# Patient Record
Sex: Female | Born: 1980 | Hispanic: No
Health system: Southern US, Community
[De-identification: ages and names within clinical notes are randomized; demographics above are authoritative.]

## PROBLEM LIST (undated history)

## (undated) DIAGNOSIS — K859 Acute pancreatitis without necrosis or infection, unspecified: Secondary | ICD-10-CM

## (undated) DIAGNOSIS — A419 Sepsis, unspecified organism: Secondary | ICD-10-CM

## (undated) DIAGNOSIS — J80 Acute respiratory distress syndrome: Secondary | ICD-10-CM

## (undated) DIAGNOSIS — J9621 Acute and chronic respiratory failure with hypoxia: Secondary | ICD-10-CM

## (undated) DIAGNOSIS — N170 Acute kidney failure with tubular necrosis: Secondary | ICD-10-CM

## (undated) DIAGNOSIS — R6520 Severe sepsis without septic shock: Secondary | ICD-10-CM

---

## 2019-07-24 ENCOUNTER — Inpatient Hospital Stay
Admission: RE | Admit: 2019-07-24 | Discharge: 2019-08-10 | Disposition: A | Discharge status: Skilled Nursing Facility | Payer: Medicare HMO | Source: Other Acute Inpatient Hospital | Attending: Internal Medicine | Admitting: Internal Medicine

## 2019-07-24 ENCOUNTER — Other Ambulatory Visit (HOSPITAL_COMMUNITY): Payer: Medicare HMO

## 2019-07-24 DIAGNOSIS — Z931 Gastrostomy status: Secondary | ICD-10-CM

## 2019-07-24 DIAGNOSIS — K859 Acute pancreatitis without necrosis or infection, unspecified: Secondary | ICD-10-CM | POA: Diagnosis present

## 2019-07-24 DIAGNOSIS — J80 Acute respiratory distress syndrome: Secondary | ICD-10-CM | POA: Diagnosis present

## 2019-07-24 DIAGNOSIS — Z95828 Presence of other vascular implants and grafts: Secondary | ICD-10-CM

## 2019-07-24 DIAGNOSIS — N170 Acute kidney failure with tubular necrosis: Secondary | ICD-10-CM | POA: Diagnosis present

## 2019-07-24 DIAGNOSIS — J9621 Acute and chronic respiratory failure with hypoxia: Secondary | ICD-10-CM | POA: Diagnosis present

## 2019-07-24 DIAGNOSIS — A419 Sepsis, unspecified organism: Secondary | ICD-10-CM | POA: Diagnosis present

## 2019-07-24 HISTORY — DX: Sepsis, unspecified organism: R65.20

## 2019-07-24 HISTORY — DX: Acute and chronic respiratory failure with hypoxia: J96.21

## 2019-07-24 HISTORY — DX: Acute pancreatitis without necrosis or infection, unspecified: K85.90

## 2019-07-24 HISTORY — DX: Acute respiratory distress syndrome: J80

## 2019-07-24 HISTORY — DX: Sepsis, unspecified organism: A41.9

## 2019-07-24 HISTORY — DX: Acute kidney failure with tubular necrosis: N17.0

## 2019-07-25 DIAGNOSIS — A419 Sepsis, unspecified organism: Secondary | ICD-10-CM

## 2019-07-25 DIAGNOSIS — K859 Acute pancreatitis without necrosis or infection, unspecified: Secondary | ICD-10-CM

## 2019-07-25 DIAGNOSIS — J9621 Acute and chronic respiratory failure with hypoxia: Secondary | ICD-10-CM

## 2019-07-25 DIAGNOSIS — J80 Acute respiratory distress syndrome: Secondary | ICD-10-CM

## 2019-07-25 DIAGNOSIS — N170 Acute kidney failure with tubular necrosis: Secondary | ICD-10-CM

## 2019-07-25 DIAGNOSIS — R6520 Severe sepsis without septic shock: Secondary | ICD-10-CM

## 2019-07-25 LAB — CBC
HCT: 24.4 % — ABNORMAL LOW (ref 36.0–46.0)
Hemoglobin: 7.7 g/dL — ABNORMAL LOW (ref 12.0–15.0)
MCH: 30 pg (ref 26.0–34.0)
MCHC: 31.6 g/dL (ref 30.0–36.0)
MCV: 94.9 fL (ref 80.0–100.0)
Platelets: 165 10*3/uL (ref 150–400)
RBC: 2.57 MIL/uL — ABNORMAL LOW (ref 3.87–5.11)
RDW: 18.1 % — ABNORMAL HIGH (ref 11.5–15.5)
WBC: 5.5 10*3/uL (ref 4.0–10.5)
nRBC: 0 % (ref 0.0–0.2)

## 2019-07-25 LAB — COMPREHENSIVE METABOLIC PANEL
ALT: 19 U/L (ref 0–44)
AST: 16 U/L (ref 15–41)
Albumin: 2.8 g/dL — ABNORMAL LOW (ref 3.5–5.0)
Alkaline Phosphatase: 109 U/L (ref 38–126)
Anion gap: 11 (ref 5–15)
BUN: 22 mg/dL — ABNORMAL HIGH (ref 6–20)
CO2: 31 mmol/L (ref 22–32)
Calcium: 9 mg/dL (ref 8.9–10.3)
Chloride: 98 mmol/L (ref 98–111)
Creatinine, Ser: 0.67 mg/dL (ref 0.44–1.00)
GFR calc Af Amer: >60 mL/min (ref >60)
GFR calc non Af Amer: >60 mL/min (ref >60)
Glucose, Bld: 202 mg/dL — ABNORMAL HIGH (ref 70–99)
Potassium: 3.3 mmol/L — ABNORMAL LOW (ref 3.5–5.1)
Sodium: 140 mmol/L (ref 135–145)
Total Bilirubin: 0.9 mg/dL (ref 0.3–1.2)
Total Protein: 6.2 g/dL — ABNORMAL LOW (ref 6.5–8.1)

## 2019-07-25 MED ORDER — GENERIC EXTERNAL MEDICATION
Status: DC
Start: ? — End: 2019-07-25

## 2019-07-25 MED ORDER — HEPARIN SODIUM (PORCINE) 5000 UNIT/ML IJ SOLN
7500.00 | INTRAMUSCULAR | Status: DC
Start: 2019-07-24 — End: 2019-07-25

## 2019-07-25 MED ORDER — HYDRALAZINE HCL 50 MG PO TABS
50.00 | ORAL_TABLET | ORAL | Status: DC
Start: 2019-07-24 — End: 2019-07-25

## 2019-07-25 MED ORDER — INSULIN ASPART 100 UNIT/ML FLEXPEN
0.00 | PEN_INJECTOR | SUBCUTANEOUS | Status: DC
Start: 2019-07-24 — End: 2019-07-25

## 2019-07-25 MED ORDER — OXCARBAZEPINE 300 MG PO TABS
600.00 | ORAL_TABLET | ORAL | Status: DC
Start: 2019-07-24 — End: 2019-07-25

## 2019-07-25 MED ORDER — ROPINIROLE HCL 0.5 MG PO TABS
1.00 | ORAL_TABLET | ORAL | Status: DC
Start: 2019-07-24 — End: 2019-07-25

## 2019-07-25 MED ORDER — INSULIN NPH (HUMAN) (ISOPHANE) 100 UNIT/ML ~~LOC~~ SUSP
25.00 | SUBCUTANEOUS | Status: DC
Start: 2019-07-24 — End: 2019-07-25

## 2019-07-25 MED ORDER — PREGABALIN 100 MG PO CAPS
100.00 | ORAL_CAPSULE | ORAL | Status: DC
Start: 2019-07-24 — End: 2019-07-25

## 2019-07-25 MED ORDER — DOXEPIN HCL 25 MG PO CAPS
25.00 | ORAL_CAPSULE | ORAL | Status: DC
Start: 2019-07-24 — End: 2019-07-25

## 2019-07-25 MED ORDER — OXCARBAZEPINE 300 MG PO TABS
300.00 | ORAL_TABLET | ORAL | Status: DC
Start: 2019-07-25 — End: 2019-07-25

## 2019-07-25 MED ORDER — SCOPOLAMINE 1 MG/3DAYS TD PT72
1.50 | MEDICATED_PATCH | TRANSDERMAL | Status: DC
Start: 2019-07-26 — End: 2019-07-25

## 2019-07-25 MED ORDER — FUROSEMIDE 10 MG/ML IJ SOLN
40.00 | INTRAMUSCULAR | Status: DC
Start: 2019-07-25 — End: 2019-07-25

## 2019-07-25 MED ORDER — ESCITALOPRAM OXALATE 10 MG PO TABS
10.00 | ORAL_TABLET | ORAL | Status: DC
Start: 2019-07-25 — End: 2019-07-25

## 2019-07-25 MED ORDER — MONTELUKAST SODIUM 10 MG PO TABS
10.00 | ORAL_TABLET | ORAL | Status: DC
Start: 2019-07-24 — End: 2019-07-25

## 2019-07-25 MED ORDER — LACTATED RINGERS IV SOLN
500.00 | INTRAVENOUS | Status: DC
Start: ? — End: 2019-07-25

## 2019-07-25 MED ORDER — PANTOPRAZOLE SODIUM 40 MG IV SOLR
40.00 | INTRAVENOUS | Status: DC
Start: 2019-07-24 — End: 2019-07-25

## 2019-07-25 MED ORDER — FAMOTIDINE 20 MG/2ML IV SOLN
20.00 | INTRAVENOUS | Status: DC
Start: 2019-07-24 — End: 2019-07-25

## 2019-07-25 MED ORDER — MELATONIN 3 MG PO TABS
3.00 | ORAL_TABLET | ORAL | Status: DC
Start: 2019-07-24 — End: 2019-07-25

## 2019-07-25 MED ORDER — AMLODIPINE BESYLATE 10 MG PO TABS
10.00 | ORAL_TABLET | ORAL | Status: DC
Start: 2019-07-25 — End: 2019-07-25

## 2019-07-25 MED ORDER — RA PROBIOTIC DIGESTIVE CARE PO CAPS
1.00 | ORAL_CAPSULE | ORAL | Status: DC
Start: 2019-07-24 — End: 2019-07-25

## 2019-07-25 MED ORDER — COLLAGENASE 250 UNIT/GM EX OINT
TOPICAL_OINTMENT | CUTANEOUS | Status: DC
Start: 2019-07-25 — End: 2019-07-25

## 2019-07-25 MED ORDER — CLONAZEPAM 1 MG PO TABS
1.00 | ORAL_TABLET | ORAL | Status: DC
Start: 2019-07-24 — End: 2019-07-25

## 2019-07-25 MED ORDER — ATORVASTATIN CALCIUM 10 MG PO TABS
5.00 | ORAL_TABLET | ORAL | Status: DC
Start: 2019-07-24 — End: 2019-07-25

## 2019-07-25 MED ORDER — UMECLIDINIUM BROMIDE 62.5 MCG/INH IN AEPB
1.00 | INHALATION_SPRAY | RESPIRATORY_TRACT | Status: DC
Start: 2019-07-24 — End: 2019-07-25

## 2019-07-25 MED ORDER — CLONIDINE HCL 0.2 MG PO TABS
0.20 | ORAL_TABLET | ORAL | Status: DC
Start: 2019-07-24 — End: 2019-07-25

## 2019-07-25 MED ORDER — TRAZODONE HCL 100 MG PO TABS
300.00 | ORAL_TABLET | ORAL | Status: DC
Start: 2019-07-24 — End: 2019-07-25

## 2019-07-25 MED ORDER — ACETAMINOPHEN 325 MG PO TABS
650.00 | ORAL_TABLET | ORAL | Status: DC
Start: ? — End: 2019-07-25

## 2019-07-25 MED ORDER — METOPROLOL TARTRATE 25 MG PO TABS
75.00 | ORAL_TABLET | ORAL | Status: DC
Start: 2019-07-24 — End: 2019-07-25

## 2019-07-25 MED ORDER — GEMFIBROZIL 600 MG PO TABS
600.00 | ORAL_TABLET | ORAL | Status: DC
Start: 2019-07-25 — End: 2019-07-25

## 2019-07-25 NOTE — Consult Note (Signed)
[facility]  Date of Service: 07/25/2019  PULMONARY CRITICAL CARE Erica Santos  VXB:[id number]  DOB: 14-Jun-1980   DOA: 07/24/2019  Referring Physician: Merton Border, MD  HPI: Erica Santos is a 39 y.o. female seen for follow up of Acute on Chronic Respiratory Failure.  Patient has multiple medical problems including CHF COPD chronic kidney disease depression diabetes hyperlipidemia hypertension anxiety disorder who presented to the hospital because of abdominal pain.  Apparently the pain had been going on for about 2 days prior to admission and was located in the epigastric and supraumbilical area.  Patient was admitted to the hospital for evaluation at that time for possibility of pancreatitis.  Hospital course was complicated patient underwent CT scan consistent with pancreatitis without abscess.  Patient was treated with IV fluids as well as antibiotics.  She had some other complications including tachycardia respiratory failure ended up on the ventilator and then was having a great deal of difficulty weaning off the ventilator.  She comes to [place] now with the T collar.  Patient also did develop progressive renal failure requiring CRRT was then transitioned over to dialysis for short-term subsequent resolution.  She has been doing T collar trials and is advancing rather quickly on her weaning now  Review of Systems:  ROS performed and is unremarkable other than noted above.  PAST MEDICAL HISTORY Past Medical History:  Diagnosis Date  . Anxiety  . Barrett esophagus  . CHF (congestive heart failure) ([place])  . Chronic GERD  . Chronic kidney disease  . COPD, mild ([place])  . Depression  . Diabetes mellitus ([place])  . Fatty liver  fatty liver  . Hx of echocardiogram 12/20/2018  2D TTE (12/02/2018) EF 60-65%, with normal diastolic filling pattern, RV not clearly visualized unfortunately.  . Hyperlipidemia  .  Hypertension  . IBS (irritable bowel syndrome)  . Kyphosis  . Overactive bladder  . Panic attacks  . Post-operative nausea and vomiting  nauseas from nerves  . PTSD (post-traumatic stress disorder)  . Scoliosis  . Splenomegaly    PAST SURGICAL HISTORY Past Surgical History:  Procedure Laterality Date  . HX ADENOIDECTOMY  . HX KNEE ARTHROSCOPY Left  . HX NASAL/SINSUS SURGERY  . HX SINUS SURGERY  . HX TONSILLECTOMY  . HX WISDOM TEETH EXTRACTION  . PR ESOPHAGOGASTRODUODENOSCOPY TRANSORAL DIAGNOSTIC N/A 06/01/2016  ESOPHAGOGASTRODUODENOSCOPY TRANSORAL DIAGNOSTIC performed by Jana Half, MD at [facility]  . PR ESOPHAGOGASTRODUODENOSCOPY TRANSORAL DIAGNOSTIC N/A 01/07/2017  ESOPHAGOGASTRODUODENOSCOPY TRANSORAL DIAGNOSTIC performed by Jana Half, MD at [facility] Allergies  Allergen Reactions  . Influenza Vaccine Tr-S 09 (Pf) Shortness of Breath/Wheezing  . Morphine Hcl Hives  . Doxycycline Nausea and Vomiting  . Aspirin Nausea and Vomiting  bleeding  . Bydureon [Exenatide Microspheres] Muscle Pain  N/V, SWEATS  . Ditropan [Oxybutynin Chloride] Nausea and Vomiting  Dizziness  . Fluconazole Rash and Nausea and Vomiting  . Tramadol Hcl Nausea and Vomiting     Medications: Reviewed on Rounds  Physical Exam:  Vitals: Temperature is 98.0 pulse 95 respiratory rate was 30 blood pressure 143/83 saturations were 98%  Ventilator Settings off the ventilator on T collar with an FiO2 35%  . General: Comfortable at this time . Eyes: Grossly normal lids, irises & conjunctiva . ENT: grossly tongue is normal . Neck: no obvious mass . Cardiovascular: S1-S2 normal no gallop or rub . Respiratory: No rhonchi no rales are noted at this  time . Abdomen: Soft and nontender . Skin: no rash seen on limited exam . Musculoskeletal: not rigid . Psychiatric:unable to assess . Neurologic: no seizure no involuntary movements         Labs on Admission:  Basic Metabolic  Panel: No results for input(s): NA, K, CL, CO2, GLUCOSE, BUN, CREATININE, CALCIUM, MG, PHOS in the last 168 hours.  No results for input(s): PHART, PCO2ART, PO2ART, HCO3, O2SAT in the last 168 hours.  Liver Function Tests: No results for input(s): AST, ALT, ALKPHOS, BILITOT, PROT, ALBUMIN in the last 168 hours. No results for input(s): LIPASE, AMYLASE in the last 168 hours. No results for input(s): AMMONIA in the last 168 hours.  CBC: No results for input(s): WBC, NEUTROABS, HGB, HCT, MCV, PLT in the last 168 hours.  Cardiac Enzymes: No results for input(s): CKTOTAL, CKMB, CKMBINDEX, TROPONINI in the last 168 hours.  BNP (last 3 results) No results for input(s): BNP in the last 8760 hours.  ProBNP (last 3 results) No results for input(s): PROBNP in the last 8760 hours.   Radiological Exams on Admission: DG ABDOMEN PEG TUBE LOCATION  Result Date: 07/24/2019 CLINICAL DATA:  G-tube placement EXAM: ABDOMEN - 1 VIEW COMPARISON:  None. FINDINGS: Injection of contrast through the percutaneous gastrostomy tube opacifies the stomach. There is no obvious free air. The bowel gas pattern is nonobstructive. IMPRESSION: Percutaneous gastrostomy tube in the stomach. No evidence of bowel obstruction. Electronically Signed   By: Katherine Mantle M.D.   On: 07/24/2019 20:34   DG Chest Port 1 View  Result Date: 07/24/2019 CLINICAL DATA:  PICC line placement EXAM: PORTABLE CHEST 1 VIEW COMPARISON:  None. FINDINGS: The tracheostomy tube terminates above the carina. There is a left-sided PICC line that projects over the SVC. The heart size is enlarged. The lung volumes are low. There is mild vascular congestion without overt pulmonary edema. There are streaky bilateral airspace opacities. There is no acute osseous abnormality. There is no pneumothorax. IMPRESSION: 1. Well-positioned left-sided PICC line. 2. Tracheostomy tube terminates above the carina. 3. Cardiomegaly. 4. Mild vascular congestion. There  is streaky bilateral airspace opacities of unknown clinical significance. These may be infectious or inflammatory in etiology. Electronically Signed   By: Katherine Mantle M.D.   On: 07/24/2019 20:34    Assessment/Plan Active Problems:   Acute on chronic respiratory failure with hypoxia (HCC)   Acute respiratory distress syndrome (ARDS) (HCC)   Severe sepsis (HCC)   Acute pancreatitis   Acute renal failure due to tubular necrosis (HCC)   1. Acute on chronic respiratory failure with hypoxia patient currently is on T collar has been on 35% FiO2 doing actually quite well.  The tracheostomy needs to be changed out we will change over to a cuffless type of trach and then begin with capping trials. 2. ARDS patient had improvement and subsequently with the last chest x-ray showing some mild pulmonary vascular congestion with airspace disease still noted which is probably resolving. 3. Severe sepsis and shock this has resolved she is hemodynamically stable. 4. Acute pancreatitis resolved we will continue to monitor her labs closely. 5. Acute renal failure she is now back to baseline renal function she had required hemodialysis at the other facility.  I have personally seen and evaluated the patient, evaluated laboratory and imaging results, formulated the assessment and plan and placed orders. The Patient requires high complexity decision making with multiple systems involvement.  Case was discussed on Rounds with the Respiratory Therapy Director and the  Respiratory staff Time Spent 78minutes  Allyne Gee, MD [facility] Pulmonary Critical Care Medicine Sleep Medicine

## 2019-07-26 ENCOUNTER — Encounter: Payer: Self-pay | Admitting: Internal Medicine

## 2019-07-26 DIAGNOSIS — J80 Acute respiratory distress syndrome: Secondary | ICD-10-CM | POA: Diagnosis not present

## 2019-07-26 DIAGNOSIS — N170 Acute kidney failure with tubular necrosis: Secondary | ICD-10-CM | POA: Diagnosis not present

## 2019-07-26 DIAGNOSIS — J9621 Acute and chronic respiratory failure with hypoxia: Secondary | ICD-10-CM | POA: Diagnosis present

## 2019-07-26 DIAGNOSIS — A419 Sepsis, unspecified organism: Secondary | ICD-10-CM | POA: Diagnosis present

## 2019-07-26 DIAGNOSIS — K859 Acute pancreatitis without necrosis or infection, unspecified: Secondary | ICD-10-CM | POA: Diagnosis present

## 2019-07-26 MED ORDER — GENERIC EXTERNAL MEDICATION
Status: DC
Start: ? — End: 2019-07-26

## 2019-07-26 NOTE — Consult Note (Signed)
Infectious Disease Consultation   [name]  IOX:[id number]  DOB: 04/05/1981  DOA: 07/24/2019  Requesting physician: Dr.Hijazi  Reason for consultation: Antibiotic recommendations   History of Present Illness: Erica Santos is an 39 y.o. female with obesity, diabetes mellitus, chronic diastolic congestive heart failure, COPD, chronic kidney disease, depression, hyperlipidemia, anxiety disorder who was admitted to the acute hospital on 06/13/2019 with abdominal pain.  She was diagnosed with acute pancreatitis.  Hospital course was complicated with worsening abdominal pain.  She underwent CT scan which showed pancreatitis without abscess.  She was treated with IV fluids and antibiotic treatment with IV ceftazidime, Flagyl, vancomycin.  On 06/25/2019 after being treated with fluids and beta-blocker for sinus tachycardia she developed acute dyspnea requiring intubation.  She also required pressors.  She was placed on the ventilator.  She had a difficult time weaning off the ventilator and underwent tracheostomy placement.   Apparently she developed abdominal compartment syndrome and required decompressive laparotomy with wound VAC on 06/16/2019 and abdomen was closed on 06/22/2019.  Subsequently she had a PEG tube placed for nutrition.  Hospital course also complicated by progressive renal failure requiring CRRT and then transitioned to dialysis for short-term.  She had fevers on 07/22/2019 but blood cultures were negative but urine culture showed ESBL E. Coli.  She was started on treatment with meropenem.  She is complaining of nausea and some abdominal discomfort.  She says she had 1 episode of loose stool.  Did not have any more diarrhea.  Denies having any chest pain.  Having some shortness of breath.  On oxygen by nasal cannula.   Review of Systems:  As per HPI otherwise 10 point review of systems negative.    Past Medical History: Past Medical History:  Diagnosis Date  . Acute on chronic  respiratory failure with hypoxia (HCC)   . Acute pancreatitis   . Acute renal failure due to tubular necrosis (HCC)   . Acute respiratory distress syndrome (ARDS) (HCC)   . Severe sepsis (HCC)   Anxiety  . Barrett esophagus  . CHF (congestive heart failure) (HCC)  . Chronic GERD  . Chronic kidney disease  . COPD, mild (HCC)  . Depression  . Diabetes mellitus (HCC)  . Fatty liver  fatty liver  . Hx of echocardiogram 12/20/2018  2D TTE (12/02/2018) EF 60-65%, with normal diastolic filling pattern, RV not clearly visualized unfortunately.  . Hyperlipidemia  . Hypertension  . IBS (irritable bowel syndrome)  . Kyphosis  . Overactive bladder  . Panic attacks  . Post-operative nausea and vomiting  nauseas from nerves  . PTSD (post-traumatic stress disorder)  . Scoliosis  . Splenomegaly   Past Surgical History: HX ADENOIDECTOMY  . HX KNEE ARTHROSCOPY Left  . HX NASAL/SINSUS SURGERY  . HX SINUS SURGERY  . HX TONSILLECTOMY  . HX WISDOM TEETH EXTRACTION  . PR ESOPHAGOGASTRODUODENOSCOPY TRANSORAL DIAGNOSTIC N/A 06/01/2016  ESOPHAGOGASTRODUODENOSCOPY TRANSORAL DIAGNOSTIC performed by Rolly Pancake, MD at [facility] ENDO  . PR ESOPHAGOGASTRODUODENOSCOPY TRANSORAL DIAGNOSTIC N/A 01/07/2017  ESOPHAGOGASTRODUODENOSCOPY TRANSORAL DIAGNOSTIC performed by Rolly Pancake, MD at [facility] ENDO    Allergies: Allergen Reactions  . Influenza Vaccine Tr-S 09 (Pf) Shortness of Breath/Wheezing  . Morphine Hcl Hives  . Doxycycline Nausea and Vomiting  . Aspirin Nausea and Vomiting  bleeding  . Bydureon [Exenatide Microspheres] Muscle Pain  N/V, SWEATS  . Ditropan [Oxybutynin Chloride] Nausea and Vomiting  Dizziness . Fluconazole Rash and Nausea and Vomiting  . Tramadol Hcl Nausea and Vomiting   Social  History: . Smoking status: Current Every Day Smoker  Packs/day: 0.75  Years: 20.00  Pack temperature 98.4, pulse 107, respiratory rate 22, blood pressure 138/78, oxygen saturation 99% 15.00  Types:  Cigarettes  . Smokeless tobacco: Never Used  . Tobacco comment: started smoking at age 67  Substance Use Topics  . Alcohol use: No  . Drug use: No    Family History: Stroke Father  . Heart Disease Mother  . Hypertension Mother  . Cancer Mother  . Thyroid Disease Mother  . Diabetes Mother  . Unknown Maternal Grandmother  . Unknown Maternal Grandfather  . Diabetes Paternal Grandmother  . Unknown Paternal Grandfather     Physical Exam: Vitals: Temperature 98.4, pulse 107, respiratory rate 32, blood pressure 138/78, pulse ox 99% Constitutional: Obese, ill-appearing female, oriented x2 Eyes: PERLA, EOMI, irises appear normal, anicteric sclera,  ENMT: external ears and nose appear normal, normal hearing, Lips appears normal Neck: Has trach in place, trach can CVS: S1-S2, no murmur Respiratory: Coarse breath sounds, rhonchi, no wheezing Abdomen: Obese, mild nonspecific tenderness without any guarding or rebound, healing surgical incision, PEG tube in place, positive bowel sounds  Musculoskeletal: Mild lower extremity edema Neuro: Cranial nerves II-XII grossly nonfocal  Psych: judgement and insight appear normal, stable mood and affect, mental status Skin: no rashes  Data reviewed:  I have personally reviewed following labs and imaging studies Labs:  CBC: Recent Labs  Lab 07/25/19 1417  WBC 5.5  HGB 7.7*  HCT 24.4*  MCV 94.9  PLT 165    Basic Metabolic Panel: Recent Labs  Lab 07/25/19 1417  NA 140  K 3.3*  CL 98  CO2 31  GLUCOSE 202*  BUN 22*  CREATININE 0.67  CALCIUM 9.0   GFR CrCl cannot be calculated (Unknown ideal weight.). Liver Function Tests: Recent Labs  Lab 07/25/19 1417  AST 16  ALT 19  ALKPHOS 109  BILITOT 0.9  PROT 6.2*  ALBUMIN 2.8*   No results for input(s): LIPASE, AMYLASE in the last 168 hours. No results for input(s): AMMONIA in the last 168 hours. Coagulation profile No results for input(s): INR, PROTIME in the last 168  hours.  Cardiac Enzymes: No results for input(s): CKTOTAL, CKMB, CKMBINDEX, TROPONINI in the last 168 hours. BNP: Invalid input(s): POCBNP CBG: No results for input(s): GLUCAP in the last 168 hours. D-Dimer No results for input(s): DDIMER in the last 72 hours. Hgb A1c No results for input(s): HGBA1C in the last 72 hours. Lipid Profile No results for input(s): CHOL, HDL, LDLCALC, TRIG, CHOLHDL, LDLDIRECT in the last 72 hours. Thyroid function studies No results for input(s): TSH, T4TOTAL, T3FREE, THYROIDAB in the last 72 hours.  Invalid input(s): FREET3 Anemia work up No results for input(s): VITAMINB12, FOLATE, FERRITIN, TIBC, IRON, RETICCTPCT in the last 72 hours. Urinalysis No results found for: COLORURINE, APPEARANCEUR, LABSPEC, PHURINE, GLUCOSEU, HGBUR, BILIRUBINUR, KETONESUR, PROTEINUR, UROBILINOGEN, NITRITE, LEUKOCYTESUR   Microbiology No results found for this or any previous visit (from the past 240 hour(s)).     Inpatient Medications:   Scheduled Meds: Please see MAR   Radiological Exams on Admission: DG ABDOMEN PEG TUBE LOCATION  Result Date: 07/24/2019 CLINICAL DATA:  G-tube placement EXAM: ABDOMEN - 1 VIEW COMPARISON:  None. FINDINGS: Injection of contrast through the percutaneous gastrostomy tube opacifies the stomach. There is no obvious free air. The bowel gas pattern is nonobstructive. IMPRESSION: Percutaneous gastrostomy tube in the stomach. No evidence of bowel obstruction. Electronically Signed   By: Katherine Mantle  M.D.   On: 07/24/2019 20:34   DG Chest Port 1 View  Result Date: 07/24/2019 CLINICAL DATA:  PICC line placement EXAM: PORTABLE CHEST 1 VIEW COMPARISON:  None. FINDINGS: The tracheostomy tube terminates above the carina. There is a left-sided PICC line that projects over the SVC. The heart size is enlarged. The lung volumes are low. There is mild vascular congestion without overt pulmonary edema. There are streaky bilateral airspace  opacities. There is no acute osseous abnormality. There is no pneumothorax. IMPRESSION: 1. Well-positioned left-sided PICC line. 2. Tracheostomy tube terminates above the carina. 3. Cardiomegaly. 4. Mild vascular congestion. There is streaky bilateral airspace opacities of unknown clinical significance. These may be infectious or inflammatory in etiology. Electronically Signed   By: Constance Holster M.D.   On: 07/24/2019 20:34    Impression/Recommendations Active Problems:   Acute on chronic respiratory failure with hypoxia (HCC)   Acute respiratory distress syndrome (ARDS) (HCC)   Severe sepsis (HCC)   Acute pancreatitis   Acute renal failure due to tubular necrosis (HCC) Abdominal compartment syndrome status post expiratory laparotomy UTI with ESBL E. coli Morbid obesity Diabetes mellitus type 2, insulin-dependent Protein calorie malnutrition Dysphagia Congestive heart failure   Acute on chronic respiratory failure with hypoxemia: Multifactorial.  She has history of COPD, smoker, congestive heart failure.  Now with recent worsening postoperatively likely secondary to ARDS.  Chest x-ray showing some mild pulmonary vascular congestion, airspace disease likely resolving.  However, she also has dysphagia and has coarse breath sounds and high risk for aspiration and worsening respiratory failure secondary to aspiration pneumonia.  Currently on meropenem which she will be completing in the next couple of days.  If her respiratory status worsens or if she starts having any fever or leukocytosis would recommend to send for respiratory cultures and repeat chest imaging preferably chest CT without contrast to better evaluate and start her on empiric IV Unasyn.  Severe sepsis with septic shock: Likely secondary to the acute pancreatitis with abdominal compartment syndrome.  Currently shock is resolved.  She was treated with multiple antibiotics at the outside facility.  The only positive cultures were  E. coli UTI.  Currently on treatment with meropenem which should cover for the UTI.  However, she is at high risk for recurrent sepsis.  If she starts having any worsening fevers, worsening leukocytosis would recommend to send for pan cultures.  In case cultures are sent, follow-up on the cultures and treat accordingly.  Diabetes mellitus: Continue to monitor Accu-Cheks and management of diabetes per the primary team.  Dysphagia: Unfortunately due to her dysphagia she is very high risk for aspiration and worsening respiratory failure secondary to aspiration pneumonia.  Protein calorie malnutrition: On tube feeds.  Further management per the primary team.  Congestive heart failure: Continue medications and management per the primary team.  Morbid obesity: Dietitian consulted.  Acute renal failure: Resolved at this time.  Continue to monitor BUN/trending closely. Due to her complex medical problems she is high risk for worsening and decompensation.  Thank you for this consultation.    Yaakov Guthrie M.D. 07/26/2019, 6:10 PM

## 2019-07-26 NOTE — Progress Notes (Signed)
Pulmonary Critical Care Medicine [facility] [place]   PULMONARY CRITICAL CARE SERVICE  PROGRESS NOTE  Date of Service: 07/26/2019  Erica Santos  QIH:[id number]  DOB: [birth year]   DOA: 07/24/2019  Referring Physician: Carron Curie, MD  HPI: Erica Santos is a 39 y.o. female seen for follow up of Acute on Chronic Respiratory Failure.  She is doing well today has been capping and actually is tolerating it well.  Secretions are minimal at this time.  Patient has made very good progress overnight  Medications: Reviewed on Rounds  Physical Exam:  Vitals: Temperature is 98.8 pulse 89 respiratory rate 20 blood pressure is 149/80 saturations 100%  Ventilator Settings off the ventilator on capping trials  . General: Comfortable at this time . Eyes: Grossly normal lids, irises & conjunctiva . ENT: grossly tongue is normal . Neck: no obvious mass . Cardiovascular: S1 S2 normal no gallop . Respiratory: No rhonchi no rales are noted at this time . Abdomen: soft . Skin: no rash seen on limited exam . Musculoskeletal: not rigid . Psychiatric:unable to assess . Neurologic: no seizure no involuntary movements         Lab Data:   Basic Metabolic Panel: Recent Labs  Lab 07/25/19 1417  NA 140  K 3.3*  CL 98  CO2 31  GLUCOSE 202*  BUN 22*  CREATININE 0.67  CALCIUM 9.0    ABG: No results for input(s): PHART, PCO2ART, PO2ART, HCO3, O2SAT in the last 168 hours.  Liver Function Tests: Recent Labs  Lab 07/25/19 1417  AST 16  ALT 19  ALKPHOS 109  BILITOT 0.9  PROT 6.2*  ALBUMIN 2.8*   No results for input(s): LIPASE, AMYLASE in the last 168 hours. No results for input(s): AMMONIA in the last 168 hours.  CBC: Recent Labs  Lab 07/25/19 1417  WBC 5.5  HGB 7.7*  HCT 24.4*  MCV 94.9  PLT 165    Cardiac Enzymes: No results for input(s): CKTOTAL, CKMB, CKMBINDEX, TROPONINI in the last 168 hours.  BNP (last 3 results) No results for input(s): BNP in the  last 8760 hours.  ProBNP (last 3 results) No results for input(s): PROBNP in the last 8760 hours.  Radiological Exams: DG ABDOMEN PEG TUBE LOCATION  Result Date: 07/24/2019 CLINICAL DATA:  G-tube placement EXAM: ABDOMEN - 1 VIEW COMPARISON:  None. FINDINGS: Injection of contrast through the percutaneous gastrostomy tube opacifies the stomach. There is no obvious free air. The bowel gas pattern is nonobstructive. IMPRESSION: Percutaneous gastrostomy tube in the stomach. No evidence of bowel obstruction. Electronically Signed   By: Katherine Mantle M.D.   On: 07/24/2019 20:34   DG Chest Port 1 View  Result Date: 07/24/2019 CLINICAL DATA:  PICC line placement EXAM: PORTABLE CHEST 1 VIEW COMPARISON:  None. FINDINGS: The tracheostomy tube terminates above the carina. There is a left-sided PICC line that projects over the SVC. The heart size is enlarged. The lung volumes are low. There is mild vascular congestion without overt pulmonary edema. There are streaky bilateral airspace opacities. There is no acute osseous abnormality. There is no pneumothorax. IMPRESSION: 1. Well-positioned left-sided PICC line. 2. Tracheostomy tube terminates above the carina. 3. Cardiomegaly. 4. Mild vascular congestion. There is streaky bilateral airspace opacities of unknown clinical significance. These may be infectious or inflammatory in etiology. Electronically Signed   By: Katherine Mantle M.D.   On: 07/24/2019 20:34    Assessment/Plan Active Problems:   Acute on chronic respiratory failure with hypoxia (HCC)  Acute respiratory distress syndrome (ARDS) (HCC)   Severe sepsis (HCC)   Acute pancreatitis   Acute renal failure due to tubular necrosis ([place])   1. Acute on chronic respiratory failure hypoxia plan is to continue to advance the wean on capping trials and slowly proceed towards decannulation 2. ARDS clinically is improving still with some restrictive changes probably as a result of the ARDS on the  chest x-ray 3. Severe sepsis with shock this is resolved we will continue with supportive care 4. Acute pancreatitis resolving 5. Acute renal failure patient had required dialysis now is off dialysis   I have personally seen and evaluated the patient, evaluated laboratory and imaging results, formulated the assessment and plan and placed orders. The Patient requires high complexity decision making with multiple systems involvement.  Rounds were done with the Respiratory Therapy Director and Staff therapists and discussed with nursing staff also.  Time 35 minutes  Allyne Gee, MD [facility] Pulmonary Critical Care Medicine Sleep Medicine

## 2019-07-27 DIAGNOSIS — J80 Acute respiratory distress syndrome: Secondary | ICD-10-CM | POA: Diagnosis not present

## 2019-07-27 DIAGNOSIS — J9621 Acute and chronic respiratory failure with hypoxia: Secondary | ICD-10-CM | POA: Diagnosis not present

## 2019-07-27 DIAGNOSIS — N170 Acute kidney failure with tubular necrosis: Secondary | ICD-10-CM | POA: Diagnosis not present

## 2019-07-27 DIAGNOSIS — K859 Acute pancreatitis without necrosis or infection, unspecified: Secondary | ICD-10-CM | POA: Diagnosis not present

## 2019-07-27 NOTE — Progress Notes (Signed)
Pulmonary Critical Care Medicine [facility] [place]   PULMONARY CRITICAL CARE SERVICE  PROGRESS NOTE  Date of Service: 07/27/2019  Erica Santos  [id number]  DOB: 04-20-1981   DOA: 07/24/2019  Referring Physician: Carron Curie, MD  HPI: Erica Santos is a 39 y.o. female seen for follow up of Acute on Chronic Respiratory Failure.  Patient is capping has been capping now for 24 hours and patient is requiring 1 L of oxygen doing well  Medications: Reviewed on Rounds  Physical Exam:  Vitals: Temperature is 98.3 pulse 78 respiratory rate 29 blood pressure is 119/61 saturations 97%  Ventilator Settings capping off the ventilator on 24-hour goal  . General: Comfortable at this time . Eyes: Grossly normal lids, irises & conjunctiva . ENT: grossly tongue is normal . Neck: no obvious mass . Cardiovascular: S1 S2 normal no gallop . Respiratory: No rhonchi no rales are noted at this time . Abdomen: soft . Skin: no rash seen on limited exam . Musculoskeletal: not rigid . Psychiatric:unable to assess . Neurologic: no seizure no involuntary movements         Lab Data:   Basic Metabolic Panel: Recent Labs  Lab 07/25/19 1417  NA 140  K 3.3*  CL 98  CO2 31  GLUCOSE 202*  BUN 22*  CREATININE 0.67  CALCIUM 9.0    ABG: No results for input(s): PHART, PCO2ART, PO2ART, HCO3, O2SAT in the last 168 hours.  Liver Function Tests: Recent Labs  Lab 07/25/19 1417  AST 16  ALT 19  ALKPHOS 109  BILITOT 0.9  PROT 6.2*  ALBUMIN 2.8*   No results for input(s): LIPASE, AMYLASE in the last 168 hours. No results for input(s): AMMONIA in the last 168 hours.  CBC: Recent Labs  Lab 07/25/19 1417  WBC 5.5  HGB 7.7*  HCT 24.4*  MCV 94.9  PLT 165    Cardiac Enzymes: No results for input(s): CKTOTAL, CKMB, CKMBINDEX, TROPONINI in the last 168 hours.  BNP (last 3 results) No results for input(s): BNP in the last 8760 hours.  ProBNP (last 3 results) No results  for input(s): PROBNP in the last 8760 hours.  Radiological Exams: No results found.  Assessment/Plan Active Problems:   Acute on chronic respiratory failure with hypoxia (HCC)   Acute respiratory distress syndrome (ARDS) (HCC)   Severe sepsis (HCC)   Acute pancreatitis   Acute renal failure due to tubular necrosis (HCC)   1. Acute on chronic respiratory failure with hypoxia doing well with the wean today will be 24 hours capping will continue to advance 2. ARDS improving 3. Severe sepsis resolved 4. Acute pancreatitis treated we will continue with supportive care 5. Acute renal failure resolved   I have personally seen and evaluated the patient, evaluated laboratory and imaging results, formulated the assessment and plan and placed orders. The Patient requires high complexity decision making with multiple systems involvement.  Rounds were done with the Respiratory Therapy Director and Staff therapists and discussed with nursing staff also.  Yevonne Pax, MD [facility] Pulmonary Critical Care Medicine Sleep Medicine

## 2019-07-28 DIAGNOSIS — J9621 Acute and chronic respiratory failure with hypoxia: Secondary | ICD-10-CM | POA: Diagnosis not present

## 2019-07-28 DIAGNOSIS — K859 Acute pancreatitis without necrosis or infection, unspecified: Secondary | ICD-10-CM | POA: Diagnosis not present

## 2019-07-28 DIAGNOSIS — J80 Acute respiratory distress syndrome: Secondary | ICD-10-CM | POA: Diagnosis not present

## 2019-07-28 DIAGNOSIS — N170 Acute kidney failure with tubular necrosis: Secondary | ICD-10-CM | POA: Diagnosis not present

## 2019-07-28 LAB — BASIC METABOLIC PANEL
Anion gap: 13 (ref 5–15)
BUN: 21 mg/dL — ABNORMAL HIGH (ref 6–20)
CO2: 33 mmol/L — ABNORMAL HIGH (ref 22–32)
Calcium: 9.5 mg/dL (ref 8.9–10.3)
Chloride: 92 mmol/L — ABNORMAL LOW (ref 98–111)
Creatinine, Ser: 0.52 mg/dL (ref 0.44–1.00)
GFR calc Af Amer: >60 mL/min (ref >60)
GFR calc non Af Amer: >60 mL/min (ref >60)
Glucose, Bld: 179 mg/dL — ABNORMAL HIGH (ref 70–99)
Potassium: 3.3 mmol/L — ABNORMAL LOW (ref 3.5–5.1)
Sodium: 138 mmol/L (ref 135–145)

## 2019-07-28 MED ORDER — GENERIC EXTERNAL MEDICATION
Status: DC
Start: ? — End: 2019-07-28

## 2019-07-28 NOTE — Progress Notes (Addendum)
Pulmonary Critical Care Medicine [facility] [place]   PULMONARY CRITICAL CARE SERVICE  PROGRESS NOTE  Date of Service: 07/28/2019  Erica Santos  [id number]  DOB: February 14, 1981   DOA: 07/24/2019  Referring Physician: Carron Curie, MD  HPI: Erica Santos is a 39 y.o. female seen for follow up of Acute on Chronic Respiratory Failure.  Patient remains capped on 2 L at this time satting well no fever or distress.  Medications: Reviewed on Rounds  Physical Exam:  Vitals: Pulse 66 respirations 28 BP 127/65 O2 sat 94% temp 97.0  Ventilator Settings capped 2 L  . General: Comfortable at this time . Eyes: Grossly normal lids, irises & conjunctiva . ENT: grossly tongue is normal . Neck: no obvious mass . Cardiovascular: S1 S2 normal no gallop . Respiratory: No rales or rhonchi noted . Abdomen: soft . Skin: no rash seen on limited exam . Musculoskeletal: not rigid . Psychiatric:unable to assess . Neurologic: no seizure no involuntary movements         Lab Data:   Basic Metabolic Panel: Recent Labs  Lab 07/25/19 1417 07/28/19 0652  NA 140 138  K 3.3* 3.3*  CL 98 92*  CO2 31 33*  GLUCOSE 202* 179*  BUN 22* 21*  CREATININE 0.67 0.52  CALCIUM 9.0 9.5    ABG: No results for input(s): PHART, PCO2ART, PO2ART, HCO3, O2SAT in the last 168 hours.  Liver Function Tests: Recent Labs  Lab 07/25/19 1417  AST 16  ALT 19  ALKPHOS 109  BILITOT 0.9  PROT 6.2*  ALBUMIN 2.8*   No results for input(s): LIPASE, AMYLASE in the last 168 hours. No results for input(s): AMMONIA in the last 168 hours.  CBC: Recent Labs  Lab 07/25/19 1417  WBC 5.5  HGB 7.7*  HCT 24.4*  MCV 94.9  PLT 165    Cardiac Enzymes: No results for input(s): CKTOTAL, CKMB, CKMBINDEX, TROPONINI in the last 168 hours.  BNP (last 3 results) No results for input(s): BNP in the last 8760 hours.  ProBNP (last 3 results) No results for input(s): PROBNP in the last 8760  hours.  Radiological Exams: No results found.  Assessment/Plan Active Problems:   Acute on chronic respiratory failure with hypoxia (HCC)   Acute respiratory distress syndrome (ARDS) (HCC)   Severe sepsis (HCC)   Acute pancreatitis   Acute renal failure due to tubular necrosis (HCC)   1. Acute on chronic respiratory failure with hypoxia continue capping trials on 2 L nasal cannula continue to wean oxygen as tolerated.  Continue supportive measures and pulmonary toilet. 2. ARDS improving 3. Severe sepsis resolved 4. Acute pancreatitis treated we will continue with supportive care 5. Acute renal failure resolved   I have personally seen and evaluated the patient, evaluated laboratory and imaging results, formulated the assessment and plan and placed orders. The Patient requires high complexity decision making with multiple systems involvement.  Rounds were done with the Respiratory Therapy Director and Staff therapists and discussed with nursing staff also.  Yevonne Pax, MD [facility] Pulmonary Critical Care Medicine Sleep Medicine

## 2019-07-29 DIAGNOSIS — N170 Acute kidney failure with tubular necrosis: Secondary | ICD-10-CM | POA: Diagnosis not present

## 2019-07-29 DIAGNOSIS — K859 Acute pancreatitis without necrosis or infection, unspecified: Secondary | ICD-10-CM | POA: Diagnosis not present

## 2019-07-29 DIAGNOSIS — J80 Acute respiratory distress syndrome: Secondary | ICD-10-CM | POA: Diagnosis not present

## 2019-07-29 DIAGNOSIS — J9621 Acute and chronic respiratory failure with hypoxia: Secondary | ICD-10-CM | POA: Diagnosis not present

## 2019-07-29 LAB — POTASSIUM: Potassium: 4.4 mmol/L (ref 3.5–5.1)

## 2019-07-29 NOTE — Progress Notes (Addendum)
Pulmonary Critical Care Medicine [facility] [place]   PULMONARY CRITICAL CARE SERVICE  PROGRESS NOTE  Date of Service: 07/29/2019  Coleta Grosshans  [id number]  DOB: Jan 30, 1981   DOA: 07/24/2019  Referring Physician: Carron Curie, MD  HPI: Erica Santos is a 39 y.o. female seen for follow up of Acute on Chronic Respiratory Failure.  Patient mains capped on 2 L satting well with no fever or distress.  Medications: Reviewed on Rounds  Physical Exam:  Vitals: Pulse 70 respiratory rate 18 BP 126/68 O2 sat 97% temp 98.7  Ventilator Settings 2 L nasal cannula  . General: Comfortable at this time . Eyes: Grossly normal lids, irises & conjunctiva . ENT: grossly tongue is normal . Neck: no obvious mass . Cardiovascular: S1 S2 normal no gallop . Respiratory: No rales or rhonchi noted . Abdomen: soft . Skin: no rash seen on limited exam . Musculoskeletal: not rigid . Psychiatric:unable to assess . Neurologic: no seizure no involuntary movements         Lab Data:   Basic Metabolic Panel: Recent Labs  Lab 07/25/19 1417 07/28/19 0652 07/29/19 0450  NA 140 138  --   K 3.3* 3.3* 4.4  CL 98 92*  --   CO2 31 33*  --   GLUCOSE 202* 179*  --   BUN 22* 21*  --   CREATININE 0.67 0.52  --   CALCIUM 9.0 9.5  --     ABG: No results for input(s): PHART, PCO2ART, PO2ART, HCO3, O2SAT in the last 168 hours.  Liver Function Tests: Recent Labs  Lab 07/25/19 1417  AST 16  ALT 19  ALKPHOS 109  BILITOT 0.9  PROT 6.2*  ALBUMIN 2.8*   No results for input(s): LIPASE, AMYLASE in the last 168 hours. No results for input(s): AMMONIA in the last 168 hours.  CBC: Recent Labs  Lab 07/25/19 1417  WBC 5.5  HGB 7.7*  HCT 24.4*  MCV 94.9  PLT 165    Cardiac Enzymes: No results for input(s): CKTOTAL, CKMB, CKMBINDEX, TROPONINI in the last 168 hours.  BNP (last 3 results) No results for input(s): BNP in the last 8760 hours.  ProBNP (last 3 results) No results for  input(s): PROBNP in the last 8760 hours.  Radiological Exams: No results found.  Assessment/Plan Active Problems:   Acute on chronic respiratory failure with hypoxia (HCC)   Acute respiratory distress syndrome (ARDS) (HCC)   Severe sepsis (HCC)   Acute pancreatitis   Acute renal failure due to tubular necrosis (HCC)   1. Acute on chronic respiratory failure with hypoxia continue capping trials using oxygen as necessary.  Continue supportive measures and pulmonary toilet. 2. ARDS improving 3. Severe sepsis resolved 4. Acute pancreatitis treated we will continue with supportive care 5. Acute renal failure resolved   I have personally seen and evaluated the patient, evaluated laboratory and imaging results, formulated the assessment and plan and placed orders. The Patient requires high complexity decision making with multiple systems involvement.  Rounds were done with the Respiratory Therapy Director and Staff therapists and discussed with nursing staff also.  Yevonne Pax, MD [facility] Pulmonary Critical Care Medicine Sleep Medicine

## 2019-07-30 DIAGNOSIS — J9621 Acute and chronic respiratory failure with hypoxia: Secondary | ICD-10-CM | POA: Diagnosis not present

## 2019-07-30 DIAGNOSIS — J80 Acute respiratory distress syndrome: Secondary | ICD-10-CM | POA: Diagnosis not present

## 2019-07-30 DIAGNOSIS — K859 Acute pancreatitis without necrosis or infection, unspecified: Secondary | ICD-10-CM | POA: Diagnosis not present

## 2019-07-30 DIAGNOSIS — N170 Acute kidney failure with tubular necrosis: Secondary | ICD-10-CM | POA: Diagnosis not present

## 2019-07-30 NOTE — Progress Notes (Signed)
Pulmonary Critical Care Medicine [facility] [place]   PULMONARY CRITICAL CARE SERVICE  PROGRESS NOTE  Date of Service: 07/30/2019  Erica Santos  [id number]  DOB: 01/28/1981   DOA: 07/24/2019  Referring Physician: Carron Curie, MD  HPI: Erica Santos is a 39 y.o. female seen for follow up of Acute on Chronic Respiratory Failure.  Patient is capping right now has been capping for 4 days ready for decannulation  Medications: Reviewed on Rounds  Physical Exam:  Vitals: Temperature 97.6 pulse 71 respiratory 17 blood pressure is 144/61 saturations 99%  Ventilator Settings capping off the ventilator  . General: Comfortable at this time . Eyes: Grossly normal lids, irises & conjunctiva . ENT: grossly tongue is normal . Neck: no obvious mass . Cardiovascular: S1 S2 normal no gallop . Respiratory: No rhonchi rales are noted . Abdomen: soft . Skin: no rash seen on limited exam . Musculoskeletal: not rigid . Psychiatric:unable to assess . Neurologic: no seizure no involuntary movements         Lab Data:   Basic Metabolic Panel: Recent Labs  Lab 07/25/19 1417 07/28/19 0652 07/29/19 0450  NA 140 138  --   K 3.3* 3.3* 4.4  CL 98 92*  --   CO2 31 33*  --   GLUCOSE 202* 179*  --   BUN 22* 21*  --   CREATININE 0.67 0.52  --   CALCIUM 9.0 9.5  --     ABG: No results for input(s): PHART, PCO2ART, PO2ART, HCO3, O2SAT in the last 168 hours.  Liver Function Tests: Recent Labs  Lab 07/25/19 1417  AST 16  ALT 19  ALKPHOS 109  BILITOT 0.9  PROT 6.2*  ALBUMIN 2.8*   No results for input(s): LIPASE, AMYLASE in the last 168 hours. No results for input(s): AMMONIA in the last 168 hours.  CBC: Recent Labs  Lab 07/25/19 1417  WBC 5.5  HGB 7.7*  HCT 24.4*  MCV 94.9  PLT 165    Cardiac Enzymes: No results for input(s): CKTOTAL, CKMB, CKMBINDEX, TROPONINI in the last 168 hours.  BNP (last 3 results) No results for input(s): BNP in the last 8760  hours.  ProBNP (last 3 results) No results for input(s): PROBNP in the last 8760 hours.  Radiological Exams: No results found.  Assessment/Plan Active Problems:   Acute on chronic respiratory failure with hypoxia (HCC)   Acute respiratory distress syndrome (ARDS) (HCC)   Severe sepsis (HCC)   Acute pancreatitis   Acute renal failure due to tubular necrosis (HCC)   1. Acute on chronic respiratory failure with hypoxia plan is to continue to advance the weaning to decannulation at this point 2. ARDS we will continue with supportive care monitor clinically is improved 3. Severe sepsis resolved we will continue to follow along 4. Acute renal failure improving clinically we will continue with supportive care 5. Acute pancreatitis resolved   I have personally seen and evaluated the patient, evaluated laboratory and imaging results, formulated the assessment and plan and placed orders. The Patient requires high complexity decision making with multiple systems involvement.  Rounds were done with the Respiratory Therapy Director and Staff therapists and discussed with nursing staff also.  Yevonne Pax, MD [facility] Pulmonary Critical Care Medicine Sleep Medicine

## 2019-07-31 ENCOUNTER — Other Ambulatory Visit (HOSPITAL_COMMUNITY): Payer: Medicare HMO

## 2019-07-31 DIAGNOSIS — J80 Acute respiratory distress syndrome: Secondary | ICD-10-CM | POA: Diagnosis not present

## 2019-07-31 DIAGNOSIS — J9621 Acute and chronic respiratory failure with hypoxia: Secondary | ICD-10-CM | POA: Diagnosis not present

## 2019-07-31 DIAGNOSIS — K859 Acute pancreatitis without necrosis or infection, unspecified: Secondary | ICD-10-CM | POA: Diagnosis not present

## 2019-07-31 DIAGNOSIS — N170 Acute kidney failure with tubular necrosis: Secondary | ICD-10-CM | POA: Diagnosis not present

## 2019-07-31 LAB — BASIC METABOLIC PANEL
Anion gap: 12 (ref 5–15)
BUN: 27 mg/dL — ABNORMAL HIGH (ref 6–20)
CO2: 34 mmol/L — ABNORMAL HIGH (ref 22–32)
Calcium: 9.9 mg/dL (ref 8.9–10.3)
Chloride: 89 mmol/L — ABNORMAL LOW (ref 98–111)
Creatinine, Ser: 0.49 mg/dL (ref 0.44–1.00)
GFR calc Af Amer: >60 mL/min (ref >60)
GFR calc non Af Amer: >60 mL/min (ref >60)
Glucose, Bld: 173 mg/dL — ABNORMAL HIGH (ref 70–99)
Potassium: 3.5 mmol/L (ref 3.5–5.1)
Sodium: 135 mmol/L (ref 135–145)

## 2019-07-31 LAB — CBC
HCT: 27.4 % — ABNORMAL LOW (ref 36.0–46.0)
Hemoglobin: 8.8 g/dL — ABNORMAL LOW (ref 12.0–15.0)
MCH: 30.6 pg (ref 26.0–34.0)
MCHC: 32.1 g/dL (ref 30.0–36.0)
MCV: 95.1 fL (ref 80.0–100.0)
Platelets: 163 10*3/uL (ref 150–400)
RBC: 2.88 MIL/uL — ABNORMAL LOW (ref 3.87–5.11)
RDW: 18.2 % — ABNORMAL HIGH (ref 11.5–15.5)
WBC: 5.7 10*3/uL (ref 4.0–10.5)
nRBC: 0 % (ref 0.0–0.2)

## 2019-07-31 NOTE — Progress Notes (Signed)
Pulmonary Critical Care Medicine [facility] [place]   PULMONARY CRITICAL CARE SERVICE  PROGRESS NOTE  Date of Service: 07/31/2019  Erica Santos  [id number]  DOB: Jul 14, 1980   DOA: 07/24/2019  Referring Physician: Carron Curie, MD  HPI: Erica Santos is a 39 y.o. female seen for follow up of Acute on Chronic Respiratory Failure.  Patient is off the ventilator right now has been on 1 L oxygen gradually being weaned  Medications: Reviewed on Rounds  Physical Exam:  Vitals: Temperature is 97.4 pulse 72 respiratory rate 22 blood pressure 93/47 saturations 96%  Ventilator Settings on 1 L O2  . General: Comfortable at this time . Eyes: Grossly normal lids, irises & conjunctiva . ENT: grossly tongue is normal . Neck: no obvious mass . Cardiovascular: S1 S2 normal no gallop . Respiratory: No rhonchi no rales are noted at this time . Abdomen: soft . Skin: no rash seen on limited exam . Musculoskeletal: not rigid . Psychiatric:unable to assess . Neurologic: no seizure no involuntary movements         Lab Data:   Basic Metabolic Panel: Recent Labs  Lab 07/25/19 1417 07/28/19 0652 07/29/19 0450 07/31/19 0700  NA 140 138  --  135  K 3.3* 3.3* 4.4 3.5  CL 98 92*  --  89*  CO2 31 33*  --  34*  GLUCOSE 202* 179*  --  173*  BUN 22* 21*  --  27*  CREATININE 0.67 0.52  --  0.49  CALCIUM 9.0 9.5  --  9.9    ABG: No results for input(s): PHART, PCO2ART, PO2ART, HCO3, O2SAT in the last 168 hours.  Liver Function Tests: Recent Labs  Lab 07/25/19 1417  AST 16  ALT 19  ALKPHOS 109  BILITOT 0.9  PROT 6.2*  ALBUMIN 2.8*   No results for input(s): LIPASE, AMYLASE in the last 168 hours. No results for input(s): AMMONIA in the last 168 hours.  CBC: Recent Labs  Lab 07/25/19 1417 07/31/19 0700  WBC 5.5 5.7  HGB 7.7* 8.8*  HCT 24.4* 27.4*  MCV 94.9 95.1  PLT 165 163    Cardiac Enzymes: No results for input(s): CKTOTAL, CKMB, CKMBINDEX, TROPONINI in  the last 168 hours.  BNP (last 3 results) No results for input(s): BNP in the last 8760 hours.  ProBNP (last 3 results) No results for input(s): PROBNP in the last 8760 hours.  Radiological Exams: No results found.  Assessment/Plan Active Problems:   Acute on chronic respiratory failure with hypoxia (HCC)   Acute respiratory distress syndrome (ARDS) (HCC)   Severe sepsis (HCC)   Acute pancreatitis   Acute renal failure due to tubular necrosis (HCC)   1. Acute on chronic respiratory failure with hypoxia continue with oxygen therapy and supportive care 2. ARDS treated we will continue with present management 3. Severe sepsis resolved 4. Acute pancreatitis improving 5. Acute renal failure following labs   I have personally seen and evaluated the patient, evaluated laboratory and imaging results, formulated the assessment and plan and placed orders. The Patient requires high complexity decision making with multiple systems involvement.  Rounds were done with the Respiratory Therapy Director and Staff therapists and discussed with nursing staff also.  Yevonne Pax, MD [facility] Pulmonary Critical Care Medicine Sleep Medicine

## 2019-08-05 LAB — MAGNESIUM: Magnesium: 1.8 mg/dL (ref 1.7–2.4)

## 2019-08-05 LAB — BASIC METABOLIC PANEL
Anion gap: 14 (ref 5–15)
BUN: 31 mg/dL — ABNORMAL HIGH (ref 6–20)
CO2: 32 mmol/L (ref 22–32)
Calcium: 9.6 mg/dL (ref 8.9–10.3)
Chloride: 86 mmol/L — ABNORMAL LOW (ref 98–111)
Creatinine, Ser: 0.61 mg/dL (ref 0.44–1.00)
GFR calc Af Amer: >60 mL/min (ref >60)
GFR calc non Af Amer: >60 mL/min (ref >60)
Glucose, Bld: 163 mg/dL — ABNORMAL HIGH (ref 70–99)
Potassium: 3.4 mmol/L — ABNORMAL LOW (ref 3.5–5.1)
Sodium: 132 mmol/L — ABNORMAL LOW (ref 135–145)

## 2019-08-05 LAB — CBC
HCT: 30 % — ABNORMAL LOW (ref 36.0–46.0)
Hemoglobin: 9.8 g/dL — ABNORMAL LOW (ref 12.0–15.0)
MCH: 31.1 pg (ref 26.0–34.0)
MCHC: 32.7 g/dL (ref 30.0–36.0)
MCV: 95.2 fL (ref 80.0–100.0)
Platelets: 147 10*3/uL — ABNORMAL LOW (ref 150–400)
RBC: 3.15 MIL/uL — ABNORMAL LOW (ref 3.87–5.11)
RDW: 17.1 % — ABNORMAL HIGH (ref 11.5–15.5)
WBC: 5 10*3/uL (ref 4.0–10.5)
nRBC: 0 % (ref 0.0–0.2)

## 2019-08-06 LAB — POTASSIUM: Potassium: 3.2 mmol/L — ABNORMAL LOW (ref 3.5–5.1)

## 2019-08-07 LAB — BASIC METABOLIC PANEL
Anion gap: 13 (ref 5–15)
BUN: 25 mg/dL — ABNORMAL HIGH (ref 6–20)
CO2: 32 mmol/L (ref 22–32)
Calcium: 9.5 mg/dL (ref 8.9–10.3)
Chloride: 90 mmol/L — ABNORMAL LOW (ref 98–111)
Creatinine, Ser: 0.52 mg/dL (ref 0.44–1.00)
GFR calc Af Amer: >60 mL/min (ref >60)
GFR calc non Af Amer: >60 mL/min (ref >60)
Glucose, Bld: 188 mg/dL — ABNORMAL HIGH (ref 70–99)
Potassium: 3.6 mmol/L (ref 3.5–5.1)
Sodium: 135 mmol/L (ref 135–145)

## 2019-08-09 LAB — SARS CORONAVIRUS 2 (TAT 6-24 HRS): SARS Coronavirus 2: NEGATIVE

## 2021-11-12 IMAGING — DX DG CHEST 1V PORT
1 series · 1 of 1 positions shown · non-contrast
Comparison: None.

CLINICAL DATA: PICC line placement

EXAM:
PORTABLE CHEST 1 VIEW

[chest ap]
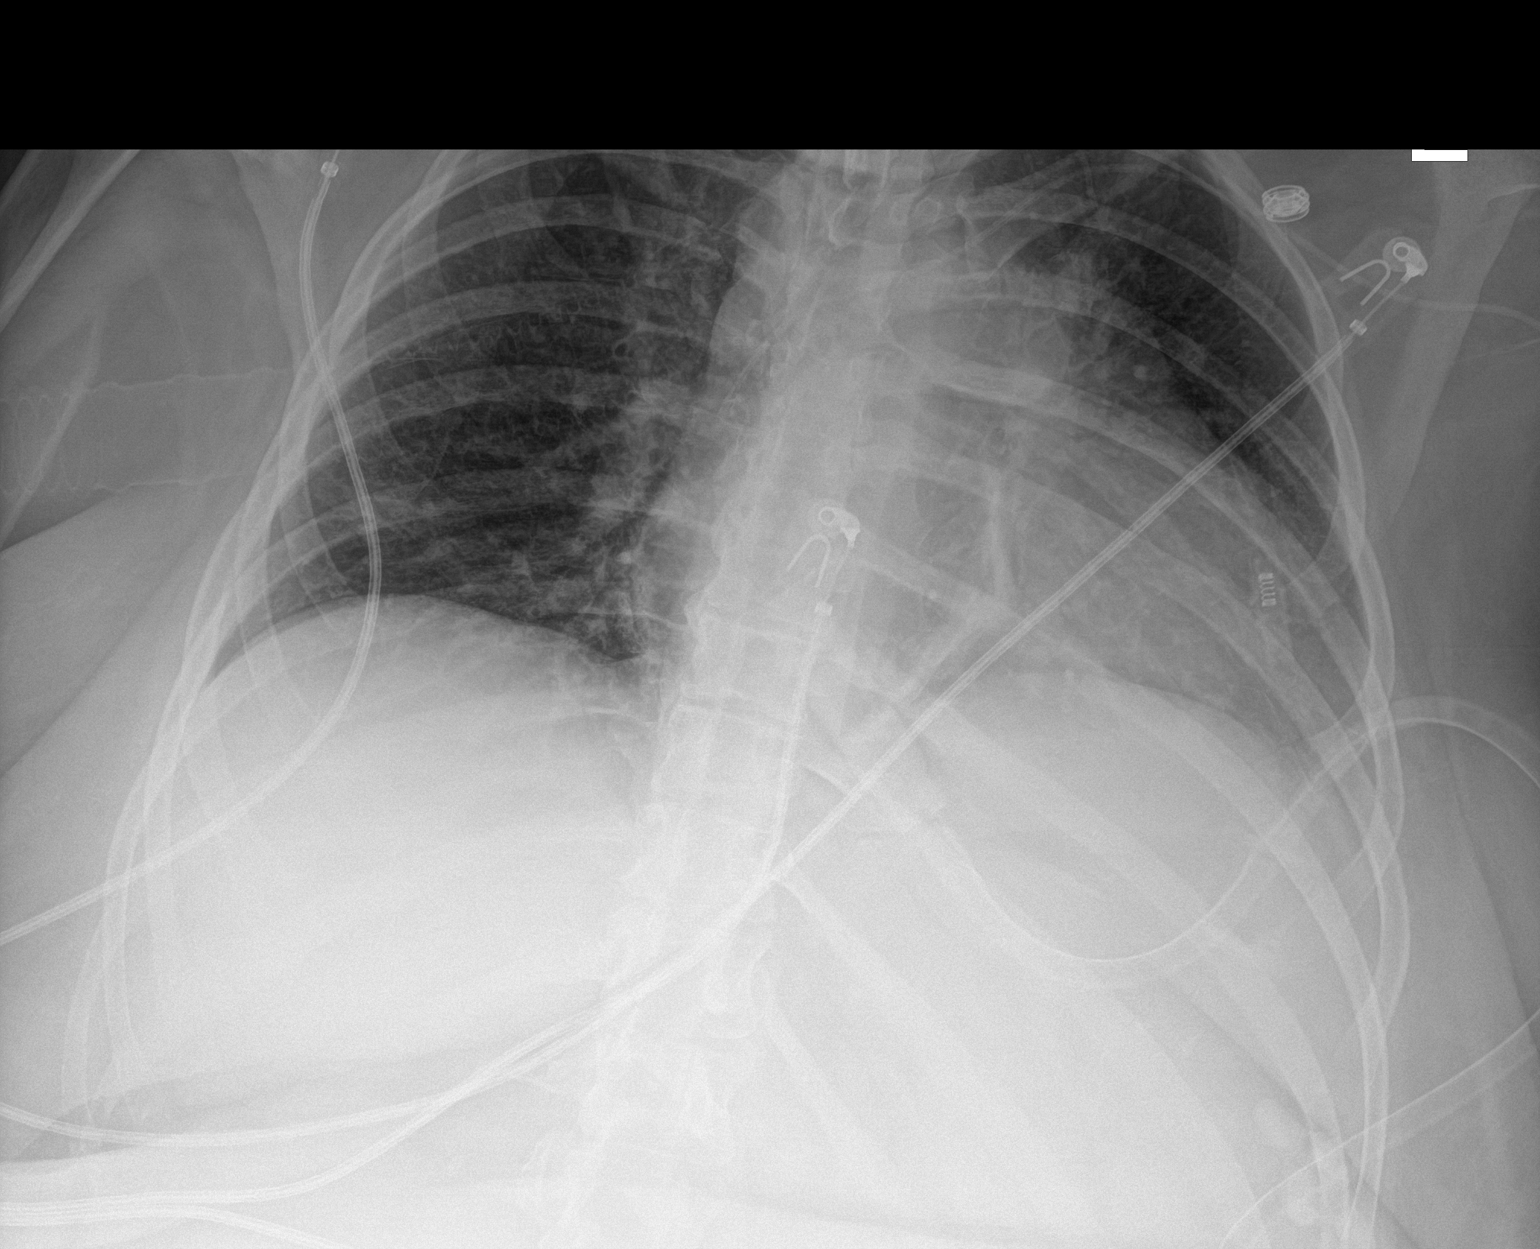

[1 of 1 positions shown; findings below may reference images not displayed]

FINDINGS: The tracheostomy tube terminates above the carina. There is a
left-sided PICC line that projects over the SVC. The heart size is
enlarged. The lung volumes are low. There is mild vascular
congestion without overt pulmonary edema. There are streaky
bilateral airspace opacities. There is no acute osseous abnormality.
There is no pneumothorax.
IMPRESSION: 1. Well-positioned left-sided PICC line.
2. Tracheostomy tube terminates above the carina.
3. Cardiomegaly.
4. Mild vascular congestion. There is streaky bilateral airspace
opacities of unknown clinical significance. These may be infectious
or inflammatory in etiology.
